# Patient Record
Sex: Male | Born: 1990 | Race: White | Hispanic: No | Marital: Married | Smoking: Current every day smoker
Health system: Southern US, Community
[De-identification: ages and names within clinical notes are randomized; demographics above are authoritative.]

---
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
## 2013-03-18 ENCOUNTER — Emergency Department (HOSPITAL_BASED_OUTPATIENT_CLINIC_OR_DEPARTMENT_OTHER): Payer: 59

## 2013-03-18 ENCOUNTER — Emergency Department (HOSPITAL_BASED_OUTPATIENT_CLINIC_OR_DEPARTMENT_OTHER)
Admission: EM | Admit: 2013-03-18 | Discharge: 2013-03-19 | Disposition: A | Discharge status: Home / Self Care | Payer: 59 | Attending: Emergency Medicine | Admitting: Emergency Medicine

## 2013-03-18 ENCOUNTER — Encounter (HOSPITAL_BASED_OUTPATIENT_CLINIC_OR_DEPARTMENT_OTHER): Payer: Self-pay | Admitting: Emergency Medicine

## 2013-03-18 DIAGNOSIS — S139XXA Sprain of joints and ligaments of unspecified parts of neck, initial encounter: Secondary | ICD-10-CM | POA: Insufficient documentation

## 2013-03-18 DIAGNOSIS — F17200 Nicotine dependence, unspecified, uncomplicated: Secondary | ICD-10-CM | POA: Diagnosis not present

## 2013-03-18 DIAGNOSIS — Y9389 Activity, other specified: Secondary | ICD-10-CM | POA: Diagnosis not present

## 2013-03-18 DIAGNOSIS — S161XXA Strain of muscle, fascia and tendon at neck level, initial encounter: Secondary | ICD-10-CM

## 2013-03-18 DIAGNOSIS — Y92410 Unspecified street and highway as the place of occurrence of the external cause: Secondary | ICD-10-CM | POA: Insufficient documentation

## 2013-03-18 DIAGNOSIS — S0993XA Unspecified injury of face, initial encounter: Secondary | ICD-10-CM | POA: Diagnosis present

## 2013-03-18 NOTE — ED Notes (Signed)
c-collar applied in triage
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
## 2013-03-19 MED ORDER — TRAMADOL HCL 50 MG PO TABS: 50.0000 mg | ORAL_TABLET | Freq: Four times a day (QID) | ORAL | Status: DC | PRN
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
## 2013-09-23 ENCOUNTER — Emergency Department (HOSPITAL_COMMUNITY)
Admission: EM | Admit: 2013-09-23 | Discharge: 2013-09-24 | Disposition: A | Discharge status: Home / Self Care | Payer: 59 | Attending: Emergency Medicine | Admitting: Emergency Medicine

## 2013-09-23 ENCOUNTER — Encounter (HOSPITAL_COMMUNITY): Payer: Self-pay | Admitting: Emergency Medicine

## 2013-09-23 DIAGNOSIS — Y9289 Other specified places as the place of occurrence of the external cause: Secondary | ICD-10-CM | POA: Insufficient documentation

## 2013-09-23 DIAGNOSIS — F17200 Nicotine dependence, unspecified, uncomplicated: Secondary | ICD-10-CM | POA: Insufficient documentation

## 2013-09-23 DIAGNOSIS — Y9389 Activity, other specified: Secondary | ICD-10-CM | POA: Insufficient documentation

## 2013-09-23 DIAGNOSIS — T6591XA Toxic effect of unspecified substance, accidental (unintentional), initial encounter: Secondary | ICD-10-CM

## 2013-09-23 DIAGNOSIS — T511X1A Toxic effect of methanol, accidental (unintentional), initial encounter: Secondary | ICD-10-CM | POA: Insufficient documentation

## 2013-09-23 NOTE — ED Notes (Signed)
Pt arrived to the Ed with a complaint of ingestion.  Pt was working with radiator fluid when he ingested about a half a cup of radiator fluid.  Pt states he has a stomach ache and feels bad.

## 2013-09-24 LAB — ETHANOL

## 2013-09-24 LAB — CBC WITH DIFFERENTIAL/PLATELET
BASOS ABS: 0 10*3/uL (ref 0.0–0.1)
BASOS PCT: 0 % (ref 0–1)
EOS ABS: 0.3 10*3/uL (ref 0.0–0.7)
Eosinophils Relative: 3 % (ref 0–5)
HCT: 40.7 % (ref 39.0–52.0)
HEMOGLOBIN: 14.6 g/dL (ref 13.0–17.0)
Lymphocytes Relative: 29 % (ref 12–46)
Lymphs Abs: 3 10*3/uL (ref 0.7–4.0)
MCH: 30.4 pg (ref 26.0–34.0)
MCHC: 35.9 g/dL (ref 30.0–36.0)
MCV: 84.6 fL (ref 78.0–100.0)
Monocytes Absolute: 0.6 10*3/uL (ref 0.1–1.0)
Monocytes Relative: 6 % (ref 3–12)
NEUTROS ABS: 6.5 10*3/uL (ref 1.7–7.7)
NEUTROS PCT: 62 % (ref 43–77)
PLATELETS: 253 10*3/uL (ref 150–400)
RBC: 4.81 MIL/uL (ref 4.22–5.81)
RDW: 12 % (ref 11.5–15.5)
WBC: 10.4 10*3/uL (ref 4.0–10.5)

## 2013-09-24 LAB — COMPREHENSIVE METABOLIC PANEL
ALBUMIN: 4.7 g/dL (ref 3.5–5.2)
ALK PHOS: 69 U/L (ref 39–117)
ALT: 14 U/L (ref 0–53)
AST: 17 U/L (ref 0–37)
Anion gap: 16 — ABNORMAL HIGH (ref 5–15)
BILIRUBIN TOTAL: 0.5 mg/dL (ref 0.3–1.2)
BUN: 11 mg/dL (ref 6–23)
CHLORIDE: 103 meq/L (ref 96–112)
CO2: 24 mEq/L (ref 19–32)
Calcium: 10 mg/dL (ref 8.4–10.5)
Creatinine, Ser: 0.87 mg/dL (ref 0.50–1.35)
GFR calc Af Amer: >90 mL/min (ref >90)
GFR calc non Af Amer: >90 mL/min (ref >90)
Glucose, Bld: 79 mg/dL (ref 70–99)
POTASSIUM: 3.9 meq/L (ref 3.7–5.3)
SODIUM: 143 meq/L (ref 137–147)
TOTAL PROTEIN: 7.9 g/dL (ref 6.0–8.3)

## 2013-09-24 LAB — RAPID URINE DRUG SCREEN, HOSP PERFORMED
AMPHETAMINES: NOT DETECTED
Barbiturates: NOT DETECTED
Benzodiazepines: NOT DETECTED
COCAINE: NOT DETECTED
OPIATES: NOT DETECTED
TETRAHYDROCANNABINOL: NOT DETECTED

## 2013-09-24 LAB — OSMOLALITY: Osmolality: 289 mOsm/kg (ref 275–300)

## 2013-09-24 LAB — ETHYLENE GLYCOL

## 2013-09-24 MED ORDER — ONDANSETRON HCL 4 MG/2ML IJ SOLN
4.0000 mg | Freq: Once | INTRAMUSCULAR | Status: AC
  Administered 2013-09-24: 4 mg via INTRAVENOUS
  Filled 2013-09-24: qty 2

## 2013-09-24 MED ORDER — SODIUM CHLORIDE 0.9 % IV BOLUS (SEPSIS)
1000.0000 mL | Freq: Once | INTRAVENOUS | Status: AC
  Administered 2013-09-24: 1000 mL via INTRAVENOUS

## 2013-09-24 NOTE — ED Provider Notes (Signed)
Patient observed for close to 6 hours in the emergency department. No evidence of acidosis on laboratory workup or significant anion gap. Doubt significant exposure to toxic substance.  Vital signs remained stable. RN discussed with poison control who recommended no further treatment in emergency department. States he can be safely be discharged home. Patient's agreement with plan. He's been given return precautions and is voiced understanding.   Loren Racer, MD 09/24/13 [phone]

## 2013-09-24 NOTE — ED Provider Notes (Signed)
CSN: [id number]     Arrival date & time 09/23/13  2337 History   First MD Initiated Contact with Patient 09/24/13 0003     Chief Complaint  Patient presents with  . Ingestion     (Consider location/radiation/quality/duration/timing/severity/associated sxs/prior Treatment) Patient is a 23 y.o. male presenting with Ingested Medication. The history is provided by the patient and medical records.  Ingestion Associated symptoms include nausea.   This is a 23 y.o. M with no significant PMH presenting to ED for accidental ingestion.  Patient states he was working on his car around 2100 when radiator fluid splashed back in his face.  Specifically it was Peak 50-50.  States he attempted to spit it back out but estimates he swallowed approx 1/2 cup of fluid, possibly a little less.  States he swallowed several times afterwards but did wash out his mouth.  States now he just feels very nauseated but has not vomited.  Denies abdominal pain, dizziness, lightheadedness, chest pain, sore throat, difficulty swallowing, or painful swallowing.  No chest pain or SOB.  Patient not currently on any medications.  Patient is adamant that this was an accidental ingestion.  He denies SI/HI/AVH.  Denies any other ingestion, alcohol use, or drug use.  History reviewed. No pertinent past medical history. History reviewed. No pertinent past surgical history. History reviewed. No pertinent family history. History  Substance Use Topics  . Smoking status: Current Every Day Smoker    Types: Cigarettes  . Smokeless tobacco: Never Used  . Alcohol Use: No    Review of Systems  Gastrointestinal: Positive for nausea.  All other systems reviewed and are negative.     Allergies  Review of patient's allergies indicates no known allergies.  Home Medications   Prior to Admission medications   Medication Sig Start Date End Date Taking? Authorizing Provider  traMADol (ULTRAM) 50 MG tablet Take 1 tablet (50 mg total) by  mouth every 6 (six) hours as needed. 03/19/13   April K Palumbo-Rasch, MD   BP 137/82  Pulse 75  Temp(Src) 98 F (36.7 C) (Oral)  Resp 18  Wt 175 lb (79.379 kg)  SpO2 100%  Physical Exam  Nursing note and vitals reviewed. Constitutional: He is oriented to person, place, and time. He appears well-developed and well-nourished. No distress.  HENT:  Head: Normocephalic and atraumatic.  Mouth/Throat: Oropharynx is clear and moist.  No chemical burns on face or in oropharynx; no bleeding; airway clear  Eyes: Conjunctivae and EOM are normal. Pupils are equal, round, and reactive to light.  Neck: Normal range of motion. Neck supple.  Cardiovascular: Normal rate, regular rhythm and normal heart sounds.   Pulmonary/Chest: Effort normal and breath sounds normal. No respiratory distress. He has no wheezes.  Abdominal: Soft. Bowel sounds are normal. There is no tenderness. There is no guarding.  Abdomen soft, non-distended, no peritoneal signs  Musculoskeletal: Normal range of motion.  Neurological: He is alert and oriented to person, place, and time.  Skin: Skin is warm and dry. He is not diaphoretic.  Psychiatric: He has a normal mood and affect. He is not withdrawn. Thought content is not delusional. He expresses no homicidal and no suicidal ideation. He expresses no suicidal plans and no homicidal plans.    ED Course  Procedures (including critical care time) Labs Review Labs Reviewed  CBC WITH DIFFERENTIAL  COMPREHENSIVE METABOLIC PANEL  ETHYLENE GLYCOL  OSMOLALITY  ETHANOL  URINE RAPID DRUG SCREEN (HOSP PERFORMED)    Imaging Review No  results found.   EKG Interpretation None      MDM   Final diagnoses:  Accidental ingestion of toxic substance, initial encounter   23 y.o. M with accidental ingestion of radiator fluid (Peak 50-50).  Patient adamant that this was accidental, denies SI/HI/AVH.  Discussed with poison control Rev[facility] Standard)-- obtain ethylene glycol and electrolytes  if truly estimates around 1/2 cup. Otherwise fluids, sx control.  Discussed with patient again, he is concerned that it was slightly less than 1/2 cup but knows it was more than a few tablespoons.  Will obtain labs, IVF and zofran given for symptomatic control.    Labs pending at this time.  Care signed out to Dr. Ranae Palms.  Will follow labs and dispo accordingly.  Garlon Hatchet, PA-C 09/24/13 [phone]

## 2013-09-24 NOTE — ED Provider Notes (Addendum)
Medical screening examination/treatment/procedure(s) were conducted as a shared visit with non-physician practitioner(s) and myself.  I personally evaluated the patient during the encounter.   EKG Interpretation None     Patient reports to possibly drinking small amount of radiator fluid after it splashed into his face. He initially had nausea but has no vomiting. Has no abdominal pain. Discussed with poison control and recommend laboratory evaluation for acidosis. Exam is benign   Loren Racer, MD 09/24/13 0102  Loren Racer, MD 09/24/13 [phone]

## 2013-09-24 NOTE — ED Notes (Signed)
Poison Control called to ask about labs. Poison Control says that the patient doe not needs medication. They also said that patient ingested the peak at 2100. She said that the patient had been at the hospital for 6 hours and that he is not altered mentally and that it is safe to say he can go home. She said that patient Ethylene glycol test will not be ran until after 7 am on 09/24/2013. She stated that the treatment that is given to patient is the right course after being told what treatment has been giving to patient.

## 2013-09-24 NOTE — Discharge Instructions (Signed)
Poisoning Information °Poisoning is illness caused by eating, drinking, touching, or inhaling a harmful substance. The damaging effects on the person's health will vary depending on the type of poison, the amount of exposure, and the duration of exposure before treatment. These effects may range from mild to very severe or even fatal.  °Most poisonings take place in the home and involve common household products. They can also occur in the workplace, especially in industrial or manufacturing facilities. Poisoning is more common in children than adults. However, poisoning often causes more serious illness in adults. Poisonings are often accidental, but there are also many cases in which a person intentionally ingests poison. °WHAT THINGS MAY BE POISONOUS?  °A poison can be any substance that causes illness or harm to the body. Poisoning is often caused by products that are commonly found in homes. Many substances can become poisonous if used in ways or amounts that are not appropriate. Some common products that can cause poisoning are:  °· Medicines, including prescription medicines, over-the-counter pain medicines, vitamins, iron pills, and herbal supplements. °· Cleaning or laundry products. °· Paint and paint thinner. °· Weed or insect killers. °· Perfume, hair spray, or nail products. °· Alcohol. °· Plants, such as philodendron, poinsettia, oleander, castor bean, cactus, and tomato plants. °· Batteries. °· Furniture polish. °· Drain cleaners. °· Antifreeze or other automotive products. °· Gasoline, lighter fluid, or lamp oil. °· Carbon monoxide gas from furnaces or automobiles. °· Toxic fumes from the burning of plastics or certain other materials. °WHAT ARE SOME FIRST-AID MEASURES FOR POISONING? °The local poison control center must be contacted whenever a person may have been exposed to poison. The poison control specialist will often give a set of directions to follow over the phone. These directions may  include the following: °· Remove any substance that is still in the mouth if the poison was not food or medicine. Drink a small amount of water. °· Keep the medicine container if too much medicine or the wrong medicine was swallowed. Use it to identify the medicine to the poison control specialist.  °· Get away from the area where exposure occurred as soon as possible if the poison was from fumes or chemicals. °· Get fresh air as soon as possible if a poison was inhaled. °· Remove any affected clothing and rinse the skin with water if a poison got on the skin.  °· Rinse the eyes with water if a poison or chemical got in the eyes. °· Begin cardiopulmonary resuscitation (CPR) if breathing stops. °HOW CAN YOU PREVENT POISONING? °Take these steps to help prevent poisoning: °· Keep medicines and chemical products in their original containers. Many of these come in child-safe packaging. Store them in areas out of reach of children. °· Educate others about the dangers of possible poisons. °· Read labels before using medicine or household products. Leave the original labels on the containers. °· Always turn on a light when taking medicine. Check the dosage every time.   °· Close the containers tightly after using medicine or chemical products. °· Get rid of unneeded and outdated medicines by following the specific disposal instructions on the medicine label or the patient information that came with the medicine. Do not put medicine in the trash or flush it down the toilet. Use the community's drug take-back program to dispose of medicine. If these options are not available, take the medicine out of the original container and mix it with an undesirable substance, such as coffee grounds or kitty litter. Seal   the mixture in a sealable bag, can, or other container and throw it away.  °· Keep all dangerous household products (such as lighter fluid, paint thinner and remover, gasoline, and antifreeze) in locked cabinets. °· Do  not mix different household chemicals with each other. °· Use protective equipment (gloves, goggles, masks, aprons) as needed when using chemicals or cleaners. °· Install a carbon monoxide detector in your home. °WHEN SHOULD YOU SEEK HELP?  °Contact the poison control center whenever you suspect that a person has been exposed to poison. Call 1-800-222-1222 (in the [place] to reach a poison center for your area. If you are outside the [place] ask your health care provider what the phone number is for your local poison control center. Keep the phone number posted near your phone. Make sure everyone in your household knows where to find the number. °The local emergency services (911 in [place] must be contacted if a person has been exposed to poison and:  °· Has trouble breathing or stops breathing. °· Develops chest pain. °· Has trouble staying awake or becomes unconscious. °· Has a seizure. °· Has severe vomiting or bleeding. °· Has a worsening headache. °· Has a decreased level of alertness. °· Develops a widespread rash that may or may not be painful. °· Has changes in vision. °· Has difficulty swallowing. °· Develops severe abdominal pain. °FOR MORE INFORMATION  °American Association of Poison Control Centers: www.aapcc.org °Document Released: 01/05/2012 Document Revised: 06/04/2013 Document Reviewed: 01/05/2012 °ExitCare® Patient Information ©2015 [employer]. This information is not intended to replace advice given to you by your health care provider. Make sure you discuss any questions you have with your health care provider. ° °
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
## 2021-07-20 ENCOUNTER — Encounter: Payer: Self-pay | Admitting: Physician Assistant

## 2021-08-14 ENCOUNTER — Ambulatory Visit: Payer: 59 | Admitting: Physician Assistant

## 2021-09-07 ENCOUNTER — Ambulatory Visit: Payer: Self-pay | Admitting: Gastroenterology

## 2021-09-07 ENCOUNTER — Other Ambulatory Visit: Payer: Self-pay

## 2023-05-25 ENCOUNTER — Other Ambulatory Visit: Payer: Self-pay

## 2023-05-25 ENCOUNTER — Emergency Department (HOSPITAL_COMMUNITY)
Admission: EM | Admit: 2023-05-25 | Discharge: 2023-05-25 | Disposition: A | Discharge status: Home / Self Care | Attending: Emergency Medicine | Admitting: Emergency Medicine

## 2023-05-25 DIAGNOSIS — S8992XA Unspecified injury of left lower leg, initial encounter: Secondary | ICD-10-CM | POA: Diagnosis present

## 2023-05-25 DIAGNOSIS — W01198A Fall on same level from slipping, tripping and stumbling with subsequent striking against other object, initial encounter: Secondary | ICD-10-CM | POA: Diagnosis not present

## 2023-05-25 DIAGNOSIS — Y92007 Garden or yard of unspecified non-institutional (private) residence as the place of occurrence of the external cause: Secondary | ICD-10-CM | POA: Diagnosis not present

## 2023-05-25 DIAGNOSIS — S81812A Laceration without foreign body, left lower leg, initial encounter: Secondary | ICD-10-CM

## 2023-05-25 DIAGNOSIS — Z23 Encounter for immunization: Secondary | ICD-10-CM | POA: Insufficient documentation

## 2023-05-25 DIAGNOSIS — W19XXXA Unspecified fall, initial encounter: Secondary | ICD-10-CM

## 2023-05-25 MED ORDER — TETANUS-DIPHTH-ACELL PERTUSSIS 5-2.5-18.5 LF-MCG/0.5 IM SUSY
0.5000 mL | PREFILLED_SYRINGE | Freq: Once | INTRAMUSCULAR | Status: AC
  Administered 2023-05-25: 0.5 mL via INTRAMUSCULAR
  Filled 2023-05-25: qty 0.5

## 2023-05-25 MED ORDER — CEPHALEXIN 500 MG PO CAPS: 500.0000 mg | ORAL_CAPSULE | Freq: Three times a day (TID) | ORAL | 0 refills | Status: AC

## 2023-05-25 MED ORDER — LIDOCAINE-EPINEPHRINE (PF) 2 %-1:200000 IJ SOLN
10.0000 mL | Freq: Once | INTRAMUSCULAR | Status: AC
  Administered 2023-05-25: 10 mL
  Filled 2023-05-25: qty 20

## 2023-05-25 NOTE — Discharge Instructions (Addendum)
 Your laceration was repaired with 3 stitches.  These will need to be removed in 7 days.  Your tetanus shot was updated today.  Antibiotic sent into the pharmacy.  You can also apply Neosporin over this area.  Return for any concerning symptoms.  You can have the stitches removed by your primary care doctor or return to the emergency room.

## 2023-05-25 NOTE — ED Provider Notes (Signed)
 [place] EMERGENCY DEPARTMENT AT [facility] Provider Note   CSN: [id number] Arrival date & time: 05/25/23  2028     History  Chief Complaint  Patient presents with   Fall   Pentrating Wound    LLE    DAO MEMMOTT is a 33 y.o. male.  33 year old male presents today for concern of a fall onto a small fence in his yard.  Denies head injury.  There is a small laceration to the back of his left thigh.  No active bleeding.  Neurovascularly intact.  The history is provided by the patient. No language interpreter was used.       Home Medications Prior to Admission medications   Medication Sig Start Date End Date Taking? Authorizing Provider  cephALEXin  (KEFLEX ) 500 MG capsule Take 1 capsule (500 mg total) by mouth 3 (three) times daily for 7 days. 05/25/23 06/01/23 Yes Kendale Rembold, PA-C  ibuprofen (ADVIL,MOTRIN) 200 MG tablet Take 800 mg by mouth every 6 (six) hours as needed for moderate pain.    [provider]  Pediatric Multiple Vit-C-FA (PEDIATRIC MULTIVITAMIN) chewable tablet Chew 1 tablet by mouth daily.    [provider]      Allergies    Patient has no known allergies.    Review of Systems   Review of Systems  Constitutional:  Negative for chills and fever.  Skin:  Positive for wound.  All other systems reviewed and are negative.   Physical Exam Updated Vital Signs BP (!) 153/95 (BP Location: Right Arm)   Pulse 76   Temp 98.6 F (37 C) (Oral)   Resp (!) 21   SpO2 100%  Physical Exam Vitals and nursing note reviewed.  Constitutional:      General: He is not in acute distress.    Appearance: Normal appearance. He is not ill-appearing.  HENT:     Head: Normocephalic and atraumatic.     Nose: Nose normal.  Eyes:     Conjunctiva/sclera: Conjunctivae normal.  Pulmonary:     Effort: Pulmonary effort is normal. No respiratory distress.  Musculoskeletal:        General: No deformity.  Skin:    Findings: No rash.      Comments: 3cm laceration to the distal left thigh.  No active bleeding.  Neurological:     Mental Status: He is alert.     ED Results / Procedures / Treatments   Labs (all labs ordered are listed, but only abnormal results are displayed) Labs Reviewed - No data to display  EKG None  Radiology No results found.  Procedures .Aaron AasLac repair Sussan Meter  Date/Time: 05/25/2023 11:03 PM  Performed by: Lucina Sabal, PA-C Authorized by: Lucina Sabal, PA-C   Consent:    Consent obtained:  Verbal   Consent given by:  Patient   Risks discussed:  Need for additional repair, infection, retained foreign body, poor cosmetic result and poor wound healing   Alternatives discussed:  No treatment Universal protocol:    Procedure explained and questions answered to patient or proxy's satisfaction: yes     Relevant documents present and verified: yes     Patient identity confirmed:  Verbally with patient and arm band Laceration details:    Length (cm):  3 Pre-procedure details:    Preparation:  Patient was prepped and draped in usual sterile fashion Treatment:    Area cleansed with:  Saline and povidone-iodine   Amount of cleaning:  Extensive   Irrigation solution:  Sterile  saline   Irrigation method:  Tap   Debridement:  None   Undermining:  None Skin repair:    Repair method:  Sutures   Suture size:  5-0   Suture material:  Prolene   Suture technique:  Simple interrupted   Number of sutures:  3 Approximation:    Approximation:  Close Repair type:    Repair type:  Simple Post-procedure details:    Dressing:  Open (no dressing)   Procedure completion:  Tolerated well, no immediate complications     Medications Ordered in ED Medications  Tdap (BOOSTRIX ) injection 0.5 mL (0.5 mLs Intramuscular Given 05/25/23 2050)  lidocaine -EPINEPHrine (XYLOCAINE W/EPI) 2 %-1:200000 (PF) injection 10 mL (10 mLs Infiltration Given 05/25/23 2230)    ED Course/ Medical Decision Making/ A&P                                  Medical Decision Making Risk Prescription drug management.   33 year old male presents today for laceration to left distal thigh.  No active bleeding.  Repaired as noted in procedure note. Keflex  prescribed.  Tetanus shot updated. Return precautions discussed.  Patient voices understanding and is in agreement with plan.   Final Clinical Impression(s) / ED Diagnoses Final diagnoses:  Fall, initial encounter  Laceration of left lower extremity, initial encounter    Rx / DC Orders ED Discharge Orders          Ordered    cephALEXin  (KEFLEX ) 500 MG capsule  3 times daily        05/25/23 2300              Lucina Sabal, PA-C 05/25/23 2304    Almond Army, MD 05/25/23 2336

## 2023-05-25 NOTE — ED Provider Triage Note (Signed)
 Emergency Medicine Provider Triage Evaluation Note  Kurt Stokes , a 33 y.o. male  was evaluated in triage.  Pt complains of fall onto a small fence.  No head injury.  This small laceration just proximal to the popliteal fossa.  No active bleeding.  Neurovascularly intact.  Review of Systems  Positive: As above Negative: As above  Physical Exam  BP (!) 153/95 (BP Location: Right Arm)   Pulse 76   Temp 98.6 F (37 C) (Oral)   Resp (!) 21   SpO2 100%  Gen:   Awake, no distress   Resp:  Normal effort  MSK:   Moves extremities without difficulty  Other:    Medical Decision Making  Medically screening exam initiated at 8:46 PM.  Appropriate orders placed.  Kurt Stokes was informed that the remainder of the evaluation will be completed by another provider, this initial triage assessment does not replace that evaluation, and the importance of remaining in the ED until their evaluation is complete.     Lucina Sabal, PA-C 05/25/23 2047

## 2023-05-25 NOTE — ED Triage Notes (Signed)
 Pt was jumping up into a tree, branch snapped, causing to fall backwards onto an iron fence. The back of his left knee was stuck on the top of a iron rod. EMS was called and assisted pt off the fence. Has a penetrating wound to the back of his right leg. Approx 1-2 in penetration. Bleeding controlled. Sensation and pulse intact to left LLE. Unknown last tetanus.
